# Patient Record
Sex: Female | Born: 1945 | Race: White | Hispanic: No | Marital: Married | Smoking: Never smoker
Health system: Southern US, Community
[De-identification: ages and names within clinical notes are randomized; demographics above are authoritative.]

## PROBLEM LIST (undated history)

## (undated) DIAGNOSIS — R319 Hematuria, unspecified: Secondary | ICD-10-CM

## (undated) DIAGNOSIS — M8580 Other specified disorders of bone density and structure, unspecified site: Secondary | ICD-10-CM

## (undated) HISTORY — DX: Other specified disorders of bone density and structure, unspecified site: M85.80

## (undated) HISTORY — DX: Hematuria, unspecified: R31.9

---

## 2000-05-08 ENCOUNTER — Emergency Department (HOSPITAL_COMMUNITY): Admission: EM | Admit: 2000-05-08 | Discharge: 2000-05-08 | Payer: Self-pay | Admitting: Emergency Medicine

## 2002-11-19 HISTORY — PX: CYSTOSCOPY: SUR368

## 2004-06-12 LAB — HM COLONOSCOPY

## 2013-02-04 DIAGNOSIS — M8580 Other specified disorders of bone density and structure, unspecified site: Secondary | ICD-10-CM | POA: Insufficient documentation

## 2013-02-13 ENCOUNTER — Encounter: Payer: Self-pay | Admitting: Family Medicine

## 2013-02-13 ENCOUNTER — Ambulatory Visit (INDEPENDENT_AMBULATORY_CARE_PROVIDER_SITE_OTHER): Payer: BC Managed Care – PPO | Admitting: Family Medicine

## 2013-02-13 ENCOUNTER — Other Ambulatory Visit (HOSPITAL_COMMUNITY)
Admission: RE | Admit: 2013-02-13 | Discharge: 2013-02-13 | Disposition: A | Discharge status: Home / Self Care | Payer: BC Managed Care – PPO | Source: Ambulatory Visit | Attending: Family Medicine | Admitting: Family Medicine

## 2013-02-13 VITALS — BP 127/78 | HR 73 | Ht 64.0 in | Wt 136.0 lb

## 2013-02-13 DIAGNOSIS — Z0000 Encounter for general adult medical examination without abnormal findings: Secondary | ICD-10-CM

## 2013-02-13 DIAGNOSIS — Z124 Encounter for screening for malignant neoplasm of cervix: Secondary | ICD-10-CM

## 2013-02-13 DIAGNOSIS — Z1151 Encounter for screening for human papillomavirus (HPV): Secondary | ICD-10-CM | POA: Insufficient documentation

## 2013-02-13 DIAGNOSIS — R87810 Cervical high risk human papillomavirus (HPV) DNA test positive: Secondary | ICD-10-CM | POA: Insufficient documentation

## 2013-02-13 DIAGNOSIS — I10 Essential (primary) hypertension: Secondary | ICD-10-CM

## 2013-02-13 DIAGNOSIS — Z01419 Encounter for gynecological examination (general) (routine) without abnormal findings: Secondary | ICD-10-CM | POA: Insufficient documentation

## 2013-02-13 LAB — POCT URINALYSIS DIPSTICK
Bilirubin, UA: NEGATIVE
Blood, UA: NEGATIVE
Clarity, UA: NEGATIVE
Glucose, UA: NEGATIVE
Ketones, UA: NEGATIVE
Nitrite, UA: NEGATIVE
Protein, UA: NEGATIVE
Spec Grav, UA: 1.025
Urobilinogen, UA: NEGATIVE
pH, UA: 6

## 2013-02-13 MED ORDER — LISINOPRIL-HYDROCHLOROTHIAZIDE 20-25 MG PO TABS: 1.0000 | ORAL_TABLET | Freq: Every day | ORAL | Status: DC

## 2013-02-13 MED ORDER — ZOLEDRONIC ACID 5 MG/100ML IV SOLN: 5.0000 mg | Freq: Once | INTRAVENOUS | Status: DC

## 2013-02-13 NOTE — Progress Notes (Signed)
Subjective:    Patient ID: Tabitha Gutierrez, female   DOB: May 15, 1946, 67 y.o.   MRN: [id number]  HPI [name] is here today for her annual CPE with pap.  She has done well since her last office visit.    Review of Systems  Constitutional: Negative for chills, appetite change, fatigue and unexpected weight change.  HENT: Negative for neck pain, neck stiffness and voice change.   Eyes: Negative for visual disturbance.  Respiratory: Negative for cough and shortness of breath.   Cardiovascular: Negative for chest pain.  Gastrointestinal: Negative for abdominal pain, diarrhea and constipation.  Genitourinary: Negative for dysuria, vaginal bleeding, vaginal discharge and pelvic pain.  Musculoskeletal: Negative for myalgias, arthralgias and gait problem.  Skin: Negative for pallor and rash.  Neurological: Negative for dizziness, weakness and light-headedness.  Psychiatric/Behavioral: Negative for sleep disturbance.       Objective:   Physical Exam  Constitutional: She is oriented to person, place, and time. She appears well-developed and well-nourished.  HENT:  Head: Normocephalic and atraumatic.  Right Ear: External ear normal.  Left Ear: External ear normal.  Nose: Nose normal.  Mouth/Throat: Oropharynx is clear and moist.  Eyes: Conjunctivae and EOM are normal. Pupils are equal, round, and reactive to light.  Neck: Normal range of motion. No thyromegaly present.  Cardiovascular: Normal rate, regular rhythm, normal heart sounds and intact distal pulses.  Exam reveals no gallop and no friction rub.   No murmur heard. Pulmonary/Chest: Effort normal and breath sounds normal.  Abdominal: Soft. Bowel sounds are normal.  Genitourinary: Uterus normal. No vaginal discharge found.  Musculoskeletal: Normal range of motion. She exhibits no edema and no tenderness.  Lymphadenopathy:    She has no cervical adenopathy.  Neurological: She is alert and oriented to person, place, and time. She has normal  reflexes.  Skin: Skin is warm and dry.  Psychiatric: She has a normal mood and affect. Her behavior is normal. Judgment and thought content normal.       Assessment:     CPE Hypertension    Plan:      Since she had an abnormal pap years ago, we'll check her from HPV 16/18 to be sure that we can discontinue pap smears in the future.    Refilled her lisinopril.  Refilled her Reclast

## 2013-02-15 ENCOUNTER — Encounter: Payer: Self-pay | Admitting: Family Medicine

## 2013-02-15 DIAGNOSIS — I10 Essential (primary) hypertension: Secondary | ICD-10-CM | POA: Insufficient documentation

## 2013-02-15 NOTE — Patient Instructions (Addendum)
Preventive Care for Adults, Female A healthy lifestyle and preventive care can promote health and wellness. Preventive health guidelines for women include the following key practices.  A routine yearly physical is a good way to check with your caregiver about your health and preventive screening. It is a chance to share any concerns and updates on your health, and to receive a thorough exam.  Visit your dentist for a routine exam and preventive care every 6 months. Brush your teeth twice a day and floss once a day. Good oral hygiene prevents tooth decay and gum disease.  The frequency of eye exams is based on your age, health, family medical history, use of contact lenses, and other factors. Follow your caregiver's recommendations for frequency of eye exams.  Eat a healthy diet. Foods like vegetables, fruits, whole grains, low-fat dairy products, and lean protein foods contain the nutrients you need without too many calories. Decrease your intake of foods high in solid fats, added sugars, and salt. Eat the right amount of calories for you.Get information about a proper diet from your caregiver, if necessary.  Regular physical exercise is one of the most important things you can do for your health. Most adults should get at least 150 minutes of moderate-intensity exercise (any activity that increases your heart rate and causes you to sweat) each week. In addition, most adults need muscle-strengthening exercises on 2 or more days a week.  Maintain a healthy weight. The body mass index (BMI) is a screening tool to identify possible weight problems. It provides an estimate of body fat based on height and weight. Your caregiver can help determine your BMI, and can help you achieve or maintain a healthy weight.For adults 20 years and older:  A BMI below 18.5 is considered underweight.  A BMI of 18.5 to 24.9 is normal.  A BMI of 25 to 29.9 is considered overweight.  A BMI of 30 and above is  considered obese.  Maintain normal blood lipids and cholesterol levels by exercising and minimizing your intake of saturated fat. Eat a balanced diet with plenty of fruit and vegetables. Blood tests for lipids and cholesterol should begin at age 20 and be repeated every 5 years. If your lipid or cholesterol levels are high, you are over 50, or you are at high risk for heart disease, you may need your cholesterol levels checked more frequently.Ongoing high lipid and cholesterol levels should be treated with medicines if diet and exercise are not effective.  If you smoke, find out from your caregiver how to quit. If you do not use tobacco, do not start.  If you are pregnant, do not drink alcohol. If you are breastfeeding, be very cautious about drinking alcohol. If you are not pregnant and choose to drink alcohol, do not exceed 1 drink per day. One drink is considered to be 12 ounces (355 mL) of beer, 5 ounces (148 mL) of wine, or 1.5 ounces (44 mL) of liquor.  Avoid use of street drugs. Do not share needles with anyone. Ask for help if you need support or instructions about stopping the use of drugs.  High blood pressure causes heart disease and increases the risk of stroke. Your blood pressure should be checked at least every 1 to 2 years. Ongoing high blood pressure should be treated with medicines if weight loss and exercise are not effective.  If you are 55 to 65 years old, ask your caregiver if you should take aspirin to prevent strokes.  Diabetes   screening involves taking a blood sample to check your fasting blood sugar level. This should be done once every 3 years, after age 45, if you are within normal weight and without risk factors for diabetes. Testing should be considered at a younger age or be carried out more frequently if you are overweight and have at least 1 risk factor for diabetes.  Breast cancer screening is essential preventive care for women. You should practice "breast  self-awareness." This means understanding the normal appearance and feel of your breasts and may include breast self-examination. Any changes detected, no matter how small, should be reported to a caregiver. Women in their 20s and 30s should have a clinical breast exam (CBE) by a caregiver as part of a regular health exam every 1 to 3 years. After age 40, women should have a CBE every year. Starting at age 40, women should consider having a mammography (breast X-ray test) every year. Women who have a family history of breast cancer should talk to their caregiver about genetic screening. Women at a high risk of breast cancer should talk to their caregivers about having magnetic resonance imaging (MRI) and a mammography every year.  The Pap test is a screening test for cervical cancer. A Pap test can show cell changes on the cervix that might become cervical cancer if left untreated. A Pap test is a procedure in which cells are obtained and examined from the lower end of the uterus (cervix).  Women should have a Pap test starting at age 21.  Between ages 21 and 29, Pap tests should be repeated every 2 years.  Beginning at age 30, you should have a Pap test every 3 years as long as the past 3 Pap tests have been normal.  Some women have medical problems that increase the chance of getting cervical cancer. Talk to your caregiver about these problems. It is especially important to talk to your caregiver if a new problem develops soon after your last Pap test. In these cases, your caregiver may recommend more frequent screening and Pap tests.  The above recommendations are the same for women who have or have not gotten the vaccine for human papillomavirus (HPV).  If you had a hysterectomy for a problem that was not cancer or a condition that could lead to cancer, then you no longer need Pap tests. Even if you no longer need a Pap test, a regular exam is a good idea to make sure no other problems are  starting.  If you are between ages 65 and 70, and you have had normal Pap tests going back 10 years, you no longer need Pap tests. Even if you no longer need a Pap test, a regular exam is a good idea to make sure no other problems are starting.  If you have had past treatment for cervical cancer or a condition that could lead to cancer, you need Pap tests and screening for cancer for at least 20 years after your treatment.  If Pap tests have been discontinued, risk factors (such as a new sexual partner) need to be reassessed to determine if screening should be resumed.  The HPV test is an additional test that may be used for cervical cancer screening. The HPV test looks for the virus that can cause the cell changes on the cervix. The cells collected during the Pap test can be tested for HPV. The HPV test could be used to screen women aged 30 years and older, and should   be used in women of any age who have unclear Pap test results. After the age of 30, women should have HPV testing at the same frequency as a Pap test.  Colorectal cancer can be detected and often prevented. Most routine colorectal cancer screening begins at the age of 50 and continues through age 75. However, your caregiver may recommend screening at an earlier age if you have risk factors for colon cancer. On a yearly basis, your caregiver may provide home test kits to check for hidden blood in the stool. Use of a small camera at the end of a tube, to directly examine the colon (sigmoidoscopy or colonoscopy), can detect the earliest forms of colorectal cancer. Talk to your caregiver about this at age 50, when routine screening begins. Direct examination of the colon should be repeated every 5 to 10 years through age 75, unless early forms of pre-cancerous polyps or small growths are found.  Hepatitis C blood testing is recommended for all people born from 1945 through 1965 and any individual with known risks for hepatitis C.  Practice  safe sex. Use condoms and avoid high-risk sexual practices to reduce the spread of sexually transmitted infections (STIs). STIs include gonorrhea, chlamydia, syphilis, trichomonas, herpes, HPV, and human immunodeficiency virus (HIV). Herpes, HIV, and HPV are viral illnesses that have no cure. They can result in disability, cancer, and death. Sexually active women aged 25 and younger should be checked for chlamydia. Older women with new or multiple partners should also be tested for chlamydia. Testing for other STIs is recommended if you are sexually active and at increased risk.  Osteoporosis is a disease in which the bones lose minerals and strength with aging. This can result in serious bone fractures. The risk of osteoporosis can be identified using a bone density scan. Women ages 65 and over and women at risk for fractures or osteoporosis should discuss screening with their caregivers. Ask your caregiver whether you should take a calcium supplement or vitamin D to reduce the rate of osteoporosis.  Menopause can be associated with physical symptoms and risks. Hormone replacement therapy is available to decrease symptoms and risks. You should talk to your caregiver about whether hormone replacement therapy is right for you.  Use sunscreen with sun protection factor (SPF) of 30 or more. Apply sunscreen liberally and repeatedly throughout the day. You should seek shade when your shadow is shorter than you. Protect yourself by wearing long sleeves, pants, a wide-brimmed hat, and sunglasses year round, whenever you are outdoors.  Once a month, do a whole body skin exam, using a mirror to look at the skin on your back. Notify your caregiver of new moles, moles that have irregular borders, moles that are larger than a pencil eraser, or moles that have changed in shape or color.  Stay current with required immunizations.  Influenza. You need a dose every fall (or winter). The composition of the flu vaccine  changes each year, so being vaccinated once is not enough.  Pneumococcal polysaccharide. You need 1 to 2 doses if you smoke cigarettes or if you have certain chronic medical conditions. You need 1 dose at age 65 (or older) if you have never been vaccinated.  Tetanus, diphtheria, pertussis (Tdap, Td). Get 1 dose of Tdap vaccine if you are younger than age 65, are over 65 and have contact with an infant, are a healthcare worker, are pregnant, or simply want to be protected from whooping cough. After that, you need a Td   booster dose every 10 years. Consult your caregiver if you have not had at least 3 tetanus and diphtheria-containing shots sometime in your life or have a deep or dirty wound.  HPV. You need this vaccine if you are a woman age 26 or younger. The vaccine is given in 3 doses over 6 months.  Measles, mumps, rubella (MMR). You need at least 1 dose of MMR if you were born in 1957 or later. You may also need a second dose.  Meningococcal. If you are age 19 to 21 and a first-year college student living in a residence hall, or have one of several medical conditions, you need to get vaccinated against meningococcal disease. You may also need additional booster doses.  Zoster (shingles). If you are age 60 or older, you should get this vaccine.  Varicella (chickenpox). If you have never had chickenpox or you were vaccinated but received only 1 dose, talk to your caregiver to find out if you need this vaccine.  Hepatitis A. You need this vaccine if you have a specific risk factor for hepatitis A virus infection or you simply wish to be protected from this disease. The vaccine is usually given as 2 doses, 6 to 18 months apart.  Hepatitis B. You need this vaccine if you have a specific risk factor for hepatitis B virus infection or you simply wish to be protected from this disease. The vaccine is given in 3 doses, usually over 6 months. Preventive Services / Frequency Ages 19 to 39  Blood  pressure check.** / Every 1 to 2 years.  Lipid and cholesterol check.** / Every 5 years beginning at age 20.  Clinical breast exam.** / Every 3 years for women in their 20s and 30s.  Pap test.** / Every 2 years from ages 21 through 29. Every 3 years starting at age 30 through age 65 or 70 with a history of 3 consecutive normal Pap tests.  HPV screening.** / Every 3 years from ages 30 through ages 65 to 70 with a history of 3 consecutive normal Pap tests.  Hepatitis C blood test.** / For any individual with known risks for hepatitis C.  Skin self-exam. / Monthly.  Influenza immunization.** / Every year.  Pneumococcal polysaccharide immunization.** / 1 to 2 doses if you smoke cigarettes or if you have certain chronic medical conditions.  Tetanus, diphtheria, pertussis (Tdap, Td) immunization. / A one-time dose of Tdap vaccine. After that, you need a Td booster dose every 10 years.  HPV immunization. / 3 doses over 6 months, if you are 26 and younger.  Measles, mumps, rubella (MMR) immunization. / You need at least 1 dose of MMR if you were born in 1957 or later. You may also need a second dose.  Meningococcal immunization. / 1 dose if you are age 19 to 21 and a first-year college student living in a residence hall, or have one of several medical conditions, you need to get vaccinated against meningococcal disease. You may also need additional booster doses.  Varicella immunization.** / Consult your caregiver.  Hepatitis A immunization.** / Consult your caregiver. 2 doses, 6 to 18 months apart.  Hepatitis B immunization.** / Consult your caregiver. 3 doses usually over 6 months. Ages 40 to 64  Blood pressure check.** / Every 1 to 2 years.  Lipid and cholesterol check.** / Every 5 years beginning at age 20.  Clinical breast exam.** / Every year after age 40.  Mammogram.** / Every year beginning at age 40   and continuing for as long as you are in good health. Consult with your  caregiver.  Pap test.** / Every 3 years starting at age 30 through age 65 or 70 with a history of 3 consecutive normal Pap tests.  HPV screening.** / Every 3 years from ages 30 through ages 65 to 70 with a history of 3 consecutive normal Pap tests.  Fecal occult blood test (FOBT) of stool. / Every year beginning at age 50 and continuing until age 75. You may not need to do this test if you get a colonoscopy every 10 years.  Flexible sigmoidoscopy or colonoscopy.** / Every 5 years for a flexible sigmoidoscopy or every 10 years for a colonoscopy beginning at age 50 and continuing until age 75.  Hepatitis C blood test.** / For all people born from 1945 through 1965 and any individual with known risks for hepatitis C.  Skin self-exam. / Monthly.  Influenza immunization.** / Every year.  Pneumococcal polysaccharide immunization.** / 1 to 2 doses if you smoke cigarettes or if you have certain chronic medical conditions.  Tetanus, diphtheria, pertussis (Tdap, Td) immunization.** / A one-time dose of Tdap vaccine. After that, you need a Td booster dose every 10 years.  Measles, mumps, rubella (MMR) immunization. / You need at least 1 dose of MMR if you were born in 1957 or later. You may also need a second dose.  Varicella immunization.** / Consult your caregiver.  Meningococcal immunization.** / Consult your caregiver.  Hepatitis A immunization.** / Consult your caregiver. 2 doses, 6 to 18 months apart.  Hepatitis B immunization.** / Consult your caregiver. 3 doses, usually over 6 months. Ages 65 and over  Blood pressure check.** / Every 1 to 2 years.  Lipid and cholesterol check.** / Every 5 years beginning at age 20.  Clinical breast exam.** / Every year after age 40.  Mammogram.** / Every year beginning at age 40 and continuing for as long as you are in good health. Consult with your caregiver.  Pap test.** / Every 3 years starting at age 30 through age 65 or 70 with a 3  consecutive normal Pap tests. Testing can be stopped between 65 and 70 with 3 consecutive normal Pap tests and no abnormal Pap or HPV tests in the past 10 years.  HPV screening.** / Every 3 years from ages 30 through ages 65 or 70 with a history of 3 consecutive normal Pap tests. Testing can be stopped between 65 and 70 with 3 consecutive normal Pap tests and no abnormal Pap or HPV tests in the past 10 years.  Fecal occult blood test (FOBT) of stool. / Every year beginning at age 50 and continuing until age 75. You may not need to do this test if you get a colonoscopy every 10 years.  Flexible sigmoidoscopy or colonoscopy.** / Every 5 years for a flexible sigmoidoscopy or every 10 years for a colonoscopy beginning at age 50 and continuing until age 75.  Hepatitis C blood test.** / For all people born from 1945 through 1965 and any individual with known risks for hepatitis C.  Osteoporosis screening.** / A one-time screening for women ages 65 and over and women at risk for fractures or osteoporosis.  Skin self-exam. / Monthly.  Influenza immunization.** / Every year.  Pneumococcal polysaccharide immunization.** / 1 dose at age 65 (or older) if you have never been vaccinated.  Tetanus, diphtheria, pertussis (Tdap, Td) immunization. / A one-time dose of Tdap vaccine if you are over   65 and have contact with an infant, are a healthcare worker, or simply want to be protected from whooping cough. After that, you need a Td booster dose every 10 years.  Varicella immunization.** / Consult your caregiver.  Meningococcal immunization.** / Consult your caregiver.  Hepatitis A immunization.** / Consult your caregiver. 2 doses, 6 to 18 months apart.  Hepatitis B immunization.** / Check with your caregiver. 3 doses, usually over 6 months. ** Family history and personal history of risk and conditions may change your caregiver's recommendations. Document Released: 01/01/2002 Document Revised: 01/28/2012  Document Reviewed: 04/02/2011 [employer] Patient Information 2013 [employer].  

## 2013-02-16 ENCOUNTER — Telehealth: Payer: Self-pay | Admitting: *Deleted

## 2013-02-16 NOTE — Telephone Encounter (Signed)
PATIENT REPORTS THAT SHE IS TAKING TRIMETHOPRIM 100 MG, ONE PER DAY

## 2013-02-16 NOTE — Telephone Encounter (Signed)
Pt called to add another med to her list. PG

## 2013-02-24 ENCOUNTER — Encounter: Payer: Self-pay | Admitting: Family Medicine

## 2013-02-26 ENCOUNTER — Encounter: Payer: Self-pay | Admitting: Family Medicine

## 2013-03-13 ENCOUNTER — Telehealth: Payer: Self-pay | Admitting: *Deleted

## 2013-03-13 NOTE — Telephone Encounter (Signed)
Called PT to inform her that we sent her Reclast infusion request to the Infusion center ([facility])  She will be contacted by the staff to schedule her next reclast infusion. PG

## 2013-03-16 ENCOUNTER — Telehealth: Payer: Self-pay | Admitting: *Deleted

## 2013-03-16 NOTE — Telephone Encounter (Signed)
PT CALLED AND FAXED SOMETHING THAT NEEDS ACTION ON YOUR PART.

## 2013-03-16 NOTE — Telephone Encounter (Signed)
Pt was informed her referral was sent to [place] and someone from [facility] will contact her to set up her infusion. PG

## 2013-03-27 ENCOUNTER — Telehealth: Payer: Self-pay | Admitting: *Deleted

## 2013-03-27 NOTE — Telephone Encounter (Signed)
PT LM ON VOICE MAIL SAYING SHE HAD AN INFUSION DONE AND THEY ADVISED HER THAT HER POTASSIUM LEVELS WERE ELEVATED- LABS SAID THEY ARE AT 5.8  PT IS WANTING A CALL BACK FROM THE NURSE.  SHE WANTS TO KNOW WHAT DR. ZANARD WANTS TO DO FOR THIS ISSUE. PT  NOT SURE WHAT TO DO. PT [phone]

## 2013-03-30 ENCOUNTER — Telehealth: Payer: Self-pay | Admitting: Family Medicine

## 2013-03-30 NOTE — Telephone Encounter (Signed)
Elease Hashimoto,  Did you speak with [place]?  Regarding elevated potassium, honestly I would have totally ignored that elevation.  99% of the time it is elevated because of the handling of the blood and the potassium comes out of the broken cells.  I called and left her a message.  I told her that if she wants to come by and have it checked then she could do that.

## 2013-03-30 NOTE — Telephone Encounter (Signed)
CONNIE AT DR. Trisha Mangle OFFICE [facility]-  CALLED TO LET DR. ZANARD KNOW THAT PT HAD LAB DONE WITH THERE OFFICE.  POTASSIUM CAME BACK AT 5.8

## 2013-03-31 ENCOUNTER — Telehealth: Payer: Self-pay | Admitting: *Deleted

## 2013-03-31 NOTE — Telephone Encounter (Signed)
Left Voice mail to contact our office.  We could recheck her potassium level in a few weeks to ensure it has returned to normal. PG

## 2013-12-20 LAB — HM MAMMOGRAPHY

## 2014-03-03 ENCOUNTER — Other Ambulatory Visit: Payer: Self-pay | Admitting: *Deleted

## 2014-03-03 DIAGNOSIS — Z0000 Encounter for general adult medical examination without abnormal findings: Secondary | ICD-10-CM

## 2014-03-04 ENCOUNTER — Other Ambulatory Visit: Payer: BC Managed Care – PPO

## 2014-03-04 LAB — COMPLETE METABOLIC PANEL WITH GFR
ALT: 16 U/L (ref 0–35)
AST: 25 U/L (ref 0–37)
Albumin: 4.8 g/dL (ref 3.5–5.2)
Alkaline Phosphatase: 51 U/L (ref 39–117)
BUN: 19 mg/dL (ref 6–23)
CO2: 28 mEq/L (ref 19–32)
Calcium: 9.9 mg/dL (ref 8.4–10.5)
Chloride: 95 mEq/L — ABNORMAL LOW (ref 96–112)
Creat: 0.98 mg/dL (ref 0.50–1.10)
GFR, Est African American: 70 mL/min
GFR, Est Non African American: 61 mL/min
Glucose, Bld: 88 mg/dL (ref 70–99)
Potassium: 4.1 mEq/L (ref 3.5–5.3)
Sodium: 134 mEq/L — ABNORMAL LOW (ref 135–145)
Total Bilirubin: 0.5 mg/dL (ref 0.2–1.2)
Total Protein: 7 g/dL (ref 6.0–8.3)

## 2014-03-04 LAB — CBC WITH DIFFERENTIAL/PLATELET
Basophils Absolute: 0.1 10*3/uL (ref 0.0–0.1)
Basophils Relative: 1 % (ref 0–1)
Eosinophils Absolute: 0.1 10*3/uL (ref 0.0–0.7)
Eosinophils Relative: 2 % (ref 0–5)
HCT: 35.7 % — ABNORMAL LOW (ref 36.0–46.0)
Hemoglobin: 11.9 g/dL — ABNORMAL LOW (ref 12.0–15.0)
Lymphocytes Relative: 25 % (ref 12–46)
Lymphs Abs: 1.3 10*3/uL (ref 0.7–4.0)
MCH: 30.1 pg (ref 26.0–34.0)
MCHC: 33.3 g/dL (ref 30.0–36.0)
MCV: 90.4 fL (ref 78.0–100.0)
Monocytes Absolute: 0.5 10*3/uL (ref 0.1–1.0)
Monocytes Relative: 10 % (ref 3–12)
Neutro Abs: 3.1 10*3/uL (ref 1.7–7.7)
Neutrophils Relative %: 62 % (ref 43–77)
Platelets: 226 10*3/uL (ref 150–400)
RBC: 3.95 MIL/uL (ref 3.87–5.11)
RDW: 13.6 % (ref 11.5–15.5)
WBC: 5 10*3/uL (ref 4.0–10.5)

## 2014-03-04 LAB — TSH: TSH: 4.644 u[IU]/mL — ABNORMAL HIGH (ref 0.350–4.500)

## 2014-03-04 LAB — LIPID PANEL
Cholesterol: 184 mg/dL (ref 0–200)
HDL: 84 mg/dL (ref >39)
LDL Cholesterol: 74 mg/dL (ref 0–99)
Total CHOL/HDL Ratio: 2.2 Ratio
Triglycerides: 128 mg/dL (ref <150)
VLDL: 26 mg/dL (ref 0–40)

## 2014-03-11 ENCOUNTER — Ambulatory Visit (INDEPENDENT_AMBULATORY_CARE_PROVIDER_SITE_OTHER): Payer: BC Managed Care – PPO | Admitting: Family Medicine

## 2014-03-11 ENCOUNTER — Encounter: Payer: Self-pay | Admitting: Family Medicine

## 2014-03-11 VITALS — BP 124/70 | HR 60 | Resp 16 | Ht 63.0 in | Wt 127.0 lb

## 2014-03-11 DIAGNOSIS — Z0000 Encounter for general adult medical examination without abnormal findings: Secondary | ICD-10-CM

## 2014-03-11 DIAGNOSIS — B977 Papillomavirus as the cause of diseases classified elsewhere: Secondary | ICD-10-CM

## 2014-03-11 DIAGNOSIS — Z124 Encounter for screening for malignant neoplasm of cervix: Secondary | ICD-10-CM

## 2014-03-11 DIAGNOSIS — Z23 Encounter for immunization: Secondary | ICD-10-CM

## 2014-03-11 DIAGNOSIS — I10 Essential (primary) hypertension: Secondary | ICD-10-CM

## 2014-03-11 LAB — POCT URINALYSIS DIPSTICK
Bilirubin, UA: NEGATIVE
Blood, UA: NEGATIVE
Glucose, UA: NEGATIVE
Ketones, UA: NEGATIVE
Leukocytes, UA: NEGATIVE
Nitrite, UA: NEGATIVE
Protein, UA: NEGATIVE
Spec Grav, UA: <=1.005
Urobilinogen, UA: NEGATIVE
pH, UA: 5

## 2014-03-11 MED ORDER — LISINOPRIL-HYDROCHLOROTHIAZIDE 20-25 MG PO TABS: 1.0000 | ORAL_TABLET | Freq: Every day | ORAL | Status: AC

## 2014-03-11 NOTE — Progress Notes (Signed)
Subjective:    Patient ID: Tabitha Gutierrez, female    DOB: October 14, 1946, 68 y.o.   MRN: [id number]  HPI   Tabitha Gutierrez is here today for a CPE/PAP and to discuss her lab results.  She also needs to have her BP medication refilled. She has done well since her last visit.  Her pap last year was normal but she did test positive for HPV 16 and was sent to Dr. Silvestre Gunner'Keeffe for a colposcopy which was negative.  He also did an ECC which was also normal.       Review of Systems  Constitutional: Negative for activity change, appetite change, fatigue and unexpected weight change.  HENT: Negative for congestion, dental problem, ear pain, hearing loss, trouble swallowing and voice change.   Eyes: Negative for pain, redness and visual disturbance.  Respiratory: Negative for cough and shortness of breath.   Cardiovascular: Negative for chest pain, palpitations and leg swelling.  Gastrointestinal: Negative for nausea, vomiting, abdominal pain, diarrhea, constipation and blood in stool.  Endocrine: Negative for cold intolerance, heat intolerance, polydipsia, polyphagia and polyuria.  Genitourinary: Negative for dysuria, urgency, frequency, hematuria, vaginal discharge and pelvic pain.  Musculoskeletal: Negative for arthralgias, back pain, joint swelling, myalgias and neck pain.  Skin: Negative for rash.  Neurological: Negative for dizziness, weakness and headaches.  Hematological: Negative for adenopathy. Does not bruise/bleed easily.  Psychiatric/Behavioral: Negative for sleep disturbance, dysphoric mood and decreased concentration. The patient is not nervous/anxious.      Past Medical History  Diagnosis Date  . Hematuria   . Osteopenia      Past Surgical History  Procedure Laterality Date  . Cystoscopy  2004    WNL - Tannenbaum     History   Social History Narrative   Marital Status:  Divorced    Children:  G1 P1/0/0/1   Pets: Cats (2)     Living Situation: Lives alone   Occupation: [employer]   Education:  14 years   Tobacco Use/Exposure:  None    Alcohol Use:  None   Drug Use:  None   Diet:  Regular   Exercise:  Walking   Hobbies: Volunteering     Family History  Problem Relation Age of Onset  . Vascular Disease Mother   . Ovarian cancer Mother   . Thyroid disease Father   . Breast cancer Maternal Aunt      Current Outpatient Prescriptions on File Prior to Visit  Medication Sig Dispense Refill  . trimethoprim (TRIMPEX) 100 MG tablet Take 100 mg by mouth 2 (two) times daily.      . zoledronic acid (RECLAST) 5 MG/100ML SOLN Inject 100 mLs (5 mg total) into the vein once.  100 mL  0   No current facility-administered medications on file prior to visit.     No Known Allergies   Immunization History  Administered Date(s) Administered  . Pneumococcal Conjugate-13 03/11/2014  . Tdap 10/31/2006  . Zoster 07/25/2007       Objective:   Physical Exam  Constitutional: She is oriented to person, place, and time. She appears well-developed and well-nourished.  HENT:  Head: Normocephalic and atraumatic.  Right Ear: External ear normal.  Left Ear: External ear normal.  Nose: Nose normal.  Mouth/Throat: Oropharynx is clear and moist.  Eyes: Conjunctivae and EOM are normal. Pupils are equal, round, and reactive to light.  Neck: Normal range of motion. No thyromegaly present.  Cardiovascular: Normal rate, regular rhythm, normal heart sounds and intact  distal pulses.  Exam reveals no gallop and no friction rub.   No murmur heard. Pulmonary/Chest: Effort normal and breath sounds normal. Right breast exhibits no inverted nipple, no mass, no nipple discharge, no skin change and no tenderness. Left breast exhibits no inverted nipple, no mass, no nipple discharge, no skin change and no tenderness. Breasts are symmetrical.  Abdominal: Soft. Bowel sounds are normal. Hernia confirmed negative in the right inguinal area and confirmed negative in the left inguinal area.   Genitourinary: Vagina normal and uterus normal. Pelvic exam was performed with patient supine. There is no rash, tenderness or lesion on the right labia. There is no rash, tenderness or lesion on the left labia. No vaginal discharge found.  Musculoskeletal: Normal range of motion. She exhibits no edema and no tenderness.  Lymphadenopathy:    She has no cervical adenopathy.       Right: No inguinal adenopathy present.       Left: No inguinal adenopathy present.  Neurological: She is alert and oriented to person, place, and time. She has normal reflexes.  Skin: Skin is warm and dry.  Psychiatric: She has a normal mood and affect. Her behavior is normal. Judgment and thought content normal.      Assessment & Plan:    Tabitha Gutierrez was seen today for annual exam and medication management.  Diagnoses and associated orders for this visit:  Routine general medical examination at a health care facility Comments: Normal exam; She is due for a colonocopy and will contact Dr. Loreta AveMann for this.   - EKG 12-Lead - POCT urinalysis dipstick  Need for prophylactic vaccination against Streptococcus pneumoniae (pneumococcus) Comments: She received Prevnar 13 without difficulty.   - Pneumococcal conjugate vaccine 13-valent  Essential hypertension, benign Comments: Her BP is very well controlled.  She may try cutting her dosage in 1/2 and will monitor her BP to make sure that it remains normal.   - lisinopril-hydrochlorothiazide (PRINZIDE,ZESTORETIC) 20-25 MG per tablet; Take 1 tablet by mouth daily.  HPV (human papilloma virus) infection Comments: Tabitha Gutierrez's pap last year showed that she is positive for HPV 16.  According to the ASCCP guidelines, we are to repeat her pap with co-testing again.     TIME SPENT "FACE TO FACE" WITH PATIENT -  45 MINS

## 2014-03-14 DIAGNOSIS — B977 Papillomavirus as the cause of diseases classified elsewhere: Secondary | ICD-10-CM | POA: Insufficient documentation

## 2014-03-14 DIAGNOSIS — Z23 Encounter for immunization: Secondary | ICD-10-CM | POA: Insufficient documentation

## 2014-03-14 DIAGNOSIS — Z0000 Encounter for general adult medical examination without abnormal findings: Secondary | ICD-10-CM | POA: Insufficient documentation

## 2014-03-14 NOTE — Patient Instructions (Signed)

## 2014-03-15 ENCOUNTER — Other Ambulatory Visit (HOSPITAL_COMMUNITY)
Admission: RE | Admit: 2014-03-15 | Discharge: 2014-03-15 | Disposition: A | Discharge status: Home / Self Care | Payer: BC Managed Care – PPO | Source: Ambulatory Visit | Attending: Family Medicine | Admitting: Family Medicine

## 2014-03-15 DIAGNOSIS — Z1151 Encounter for screening for human papillomavirus (HPV): Secondary | ICD-10-CM | POA: Insufficient documentation

## 2014-03-15 DIAGNOSIS — Z124 Encounter for screening for malignant neoplasm of cervix: Secondary | ICD-10-CM | POA: Insufficient documentation

## 2014-03-15 DIAGNOSIS — R87810 Cervical high risk human papillomavirus (HPV) DNA test positive: Secondary | ICD-10-CM | POA: Insufficient documentation

## 2014-03-15 NOTE — Addendum Note (Signed)
Addended by: Clint BolderGIL, Anderson Middlebrooks D on: 03/15/2014 08:07 AM   Modules accepted: Orders

## 2014-04-01 ENCOUNTER — Telehealth: Payer: Self-pay

## 2014-04-01 NOTE — Telephone Encounter (Signed)
error 

## 2014-04-15 ENCOUNTER — Other Ambulatory Visit: Payer: Self-pay | Admitting: Family Medicine

## 2014-04-15 DIAGNOSIS — M8580 Other specified disorders of bone density and structure, unspecified site: Secondary | ICD-10-CM

## 2014-04-15 MED ORDER — RISEDRONATE SODIUM 150 MG PO TABS: 150.0000 mg | ORAL_TABLET | ORAL | Status: AC

## 2016-08-15 ENCOUNTER — Emergency Department (HOSPITAL_COMMUNITY): Payer: BLUE CROSS/BLUE SHIELD

## 2016-08-15 ENCOUNTER — Encounter (HOSPITAL_COMMUNITY): Payer: Self-pay | Admitting: Emergency Medicine

## 2016-08-15 ENCOUNTER — Emergency Department (HOSPITAL_COMMUNITY)
Admission: EM | Admit: 2016-08-15 | Discharge: 2016-08-15 | Disposition: A | Discharge status: Home / Self Care | Payer: BLUE CROSS/BLUE SHIELD | Attending: Emergency Medicine | Admitting: Emergency Medicine

## 2016-08-15 DIAGNOSIS — W010XXA Fall on same level from slipping, tripping and stumbling without subsequent striking against object, initial encounter: Secondary | ICD-10-CM | POA: Diagnosis not present

## 2016-08-15 DIAGNOSIS — Y929 Unspecified place or not applicable: Secondary | ICD-10-CM | POA: Diagnosis not present

## 2016-08-15 DIAGNOSIS — S52591A Other fractures of lower end of right radius, initial encounter for closed fracture: Secondary | ICD-10-CM | POA: Diagnosis not present

## 2016-08-15 DIAGNOSIS — IMO0002 Reserved for concepts with insufficient information to code with codable children: Secondary | ICD-10-CM

## 2016-08-15 DIAGNOSIS — S52501A Unspecified fracture of the lower end of right radius, initial encounter for closed fracture: Secondary | ICD-10-CM

## 2016-08-15 DIAGNOSIS — I10 Essential (primary) hypertension: Secondary | ICD-10-CM | POA: Diagnosis not present

## 2016-08-15 DIAGNOSIS — Y999 Unspecified external cause status: Secondary | ICD-10-CM | POA: Diagnosis not present

## 2016-08-15 DIAGNOSIS — Z79899 Other long term (current) drug therapy: Secondary | ICD-10-CM | POA: Diagnosis not present

## 2016-08-15 DIAGNOSIS — S6991XA Unspecified injury of right wrist, hand and finger(s), initial encounter: Secondary | ICD-10-CM | POA: Diagnosis present

## 2016-08-15 DIAGNOSIS — S01111A Laceration without foreign body of right eyelid and periocular area, initial encounter: Secondary | ICD-10-CM | POA: Diagnosis not present

## 2016-08-15 DIAGNOSIS — W19XXXA Unspecified fall, initial encounter: Secondary | ICD-10-CM

## 2016-08-15 DIAGNOSIS — Y9301 Activity, walking, marching and hiking: Secondary | ICD-10-CM | POA: Insufficient documentation

## 2016-08-15 MED ORDER — BACITRACIN ZINC 500 UNIT/GM EX OINT
TOPICAL_OINTMENT | Freq: Two times a day (BID) | CUTANEOUS | Status: DC
  Administered 2016-08-15: 1 via TOPICAL
  Filled 2016-08-15: qty 1.8

## 2016-08-15 MED ORDER — BUPIVACAINE HCL (PF) 0.5 % IJ SOLN: 30.0000 mL | Freq: Once | INTRAMUSCULAR | Status: DC

## 2016-08-15 MED ORDER — FENTANYL CITRATE (PF) 100 MCG/2ML IJ SOLN
50.0000 ug | Freq: Once | INTRAMUSCULAR | Status: AC
  Administered 2016-08-15: 50 ug via INTRAVENOUS
  Filled 2016-08-15: qty 2

## 2016-08-15 MED ORDER — HYDROCODONE-ACETAMINOPHEN 5-325 MG PO TABS: 1.0000 | ORAL_TABLET | Freq: Four times a day (QID) | ORAL | 0 refills | Status: AC | PRN

## 2016-08-15 NOTE — Discharge Instructions (Signed)
Read the information below.  You have a fracture of your radius and ulna. You are being placed in a splint. Please wear splint until you are evaluated by orthopedics. I spoke with orthopedics - please call today to schedule an appointment in office tomorrow.  I have prescribed vicodin for severe pain. Please take as needed.  You can try motrin for mild/moderate pain. Ice affected area for 20 minute increments.  Use the prescribed medication as directed. Please discuss all new medications with your pharmacist.   You may return to the Emergency Department at any time for worsening condition or any new symptoms that concern you. Return to ED if uncontrolled pain, you develop change in color to your fingers, fingers become cold, confusion/lethargy, vomiting, or severe headache.

## 2016-08-15 NOTE — ED Notes (Signed)
Discharge instructions, follow up care, and rx x1 reviewed with patient. Patient verbalized understanding. 

## 2016-08-15 NOTE — ED Notes (Signed)
Patient ambulated without difficulty. 

## 2016-08-15 NOTE — ED Provider Notes (Signed)
[facility]-EMERGENCY DEPT Provider Note   CSN: [id number] Arrival date & time: 08/15/16  4098     History   Chief Complaint Chief Complaint  Patient presents with  . Fall  . Wrist Injury    HPI Tabitha Gutierrez is a 70 y.o. female.  Tabitha Gutierrez is a 70 y.o. female with w/o osteopenia and HTN presents to ED with complaint of fall. Patient was on her usual morning walk when she tripped and fell on an uneven sidewalk this morning. She broke the fall with her right hand. She has associated swelling, deformity, ecchymosis, superficial abrasion, and numbness at her right wrist. She also reports she hit her head on the ground and has an associated laceration above her right eye. She denies LOC. She is not on any anti-coagulation therapy. No treatments tried PTA.       Past Medical History:  Diagnosis Date  . Hematuria   . Osteopenia     Patient Active Problem List   Diagnosis Date Noted  . Routine general medical examination at a health care facility 03/14/2014  . Need for prophylactic vaccination against Streptococcus pneumoniae (pneumococcus) 03/14/2014  . HPV (human papilloma virus) infection 03/14/2014  . Essential hypertension, benign 02/15/2013  . Osteopenia     Past Surgical History:  Procedure Laterality Date  . CYSTOSCOPY  2004   WNL - Tannenbaum    OB History    No data available       Home Medications    Prior to Admission medications   Medication Sig Start Date End Date Taking? Authorizing Provider  Ascorbic Acid (VITAMIN C) 1000 MG tablet Take 1,000 mg by mouth daily.   Yes Historical Provider, MD  CALCIUM PO Take 1 tablet by mouth daily.   Yes Historical Provider, MD  Cholecalciferol (VITAMIN D3) 1000 units CAPS Take 2,000 Units by mouth daily.   Yes Historical Provider, MD  ferrous sulfate 325 (65 FE) MG tablet Take 325 mg by mouth daily.   Yes Historical Provider, MD  lisinopril-hydrochlorothiazide (PRINZIDE,ZESTORETIC) 10-12.5 MG tablet Take 1 tablet by  mouth daily. 07/31/16  Yes Historical Provider, MD  Multiple Vitamin (MULTIVITAMIN WITH MINERALS) TABS tablet Take 1 tablet by mouth daily.   Yes Historical Provider, MD  trimethoprim (TRIMPEX) 100 MG tablet Take 50 mg by mouth 3 (three) times a week. Mondays, Wednesdays, and Fridays   Yes Historical Provider, MD  HYDROcodone-acetaminophen (NORCO/VICODIN) 5-325 MG tablet Take 1 tablet by mouth every 6 (six) hours as needed. 08/15/16   Lona Kettle, PA-C  lisinopril-hydrochlorothiazide (PRINZIDE,ZESTORETIC) 20-25 MG per tablet Take 1 tablet by mouth daily. 03/11/14 04/06/15  Gillian Scarce, MD    Family History Family History  Problem Relation Age of Onset  . Vascular Disease Mother   . Ovarian cancer Mother   . Thyroid disease Father   . Breast cancer Maternal Aunt     Social History Social History  Substance Use Topics  . Smoking status: Never Smoker  . Smokeless tobacco: Never Used  . Alcohol use No     Allergies   Review of patient's allergies indicates no known allergies.   Review of Systems Review of Systems  Constitutional: Negative for chills, diaphoresis and fever.  HENT: Negative for trouble swallowing.   Eyes: Negative for visual disturbance.  Respiratory: Negative for shortness of breath.   Cardiovascular: Negative for chest pain.  Gastrointestinal: Negative for abdominal pain, nausea and vomiting.  Genitourinary: Negative for dysuria and hematuria.  Musculoskeletal: Positive for arthralgias and  joint swelling. Negative for neck pain.  Skin: Positive for wound.  Neurological: Positive for numbness. Negative for dizziness, syncope, weakness, light-headedness and headaches.     Physical Exam Updated Vital Signs BP 122/58 (BP Location: Left Arm)   Pulse 60   Temp 98.4 F (36.9 C) (Oral)   Resp 14   Ht 5\' 4"  (1.626 m)   Wt 59 kg   SpO2 99%   BMI 22.31 kg/m   Physical Exam  Constitutional: She appears well-developed and well-nourished. No distress.    HENT:  Head: Normocephalic. Head is with laceration. Head is without raccoon's eyes and without Battle's sign.    Mouth/Throat: Uvula is midline and oropharynx is clear and moist. No trismus in the jaw. No oropharyngeal exudate.  Eyes: Conjunctivae and EOM are normal. Pupils are equal, round, and reactive to light. Right eye exhibits no discharge. Left eye exhibits no discharge. No scleral icterus.  Neck: Normal range of motion and phonation normal. Neck supple. No neck rigidity. Normal range of motion present.  Neck ROM intact.   Cardiovascular: Normal rate, regular rhythm, normal heart sounds and intact distal pulses.   No murmur heard. Pulmonary/Chest: Effort normal and breath sounds normal. No stridor. No respiratory distress. She has no wheezes. She has no rales.  Abdominal: She exhibits no distension. There is no rigidity and no CVA tenderness.  Musculoskeletal:  Right wrist: obvious deformity, swelling, and ecchymosis noted. Patient able to move fingers. Sensation intact. Capillary refill <3seconds.   Lymphadenopathy:    She has no cervical adenopathy.  Neurological: She is alert. She is not disoriented. Coordination and gait normal. GCS eye subscore is 4. GCS verbal subscore is 5. GCS motor subscore is 6.  Mental Status:  Alert, thought content appropriate, able to give a coherent history. Speech fluent without evidence of aphasia. Able to follow 2 step commands without difficulty.  Cranial Nerves:  II:  Peripheral visual fields grossly normal, pupils equal, round, reactive to light III,IV, VI: ptosis not present, extra-ocular motions intact bilaterally  V,VII: smile symmetric, facial light touch sensation equal VIII: hearing grossly normal to voice  X: uvula elevates symmetrically  XI: bilateral shoulder shrug symmetric and strong XII: midline tongue extension without fassiculations Motor:  Normal tone. Right strength testing deferred secondary to fx, 5/5 LUE, 5/5 lower  extremities bilaterally including strong and equal  Dorsiflexion/plantar flexion Sensory: light touch normal in all extremities. Cerebellar: normal finger-to-nose with left, deferred right secondary to fx.  Gait: normal gait and balance CV: distal pulses palpable throughout   Skin: Skin is warm and dry. She is not diaphoretic.     Psychiatric: She has a normal mood and affect. Her behavior is normal.     ED Treatments / Results  Labs (all labs ordered are listed, but only abnormal results are displayed) Labs Reviewed - No data to display  EKG  EKG Interpretation None       Radiology Dg Wrist Complete Right  Result Date: 08/15/2016 CLINICAL DATA:  Pain following fall EXAM: RIGHT WRIST - COMPLETE 3+ VIEW COMPARISON:  None. FINDINGS: Frontal, oblique, and lateral views were obtained. There is a comminuted fracture of the distal radial metaphysis with fracture fragments extending into the radiocarpal joint space. There is dorsal angulation distally. Several fracture fragments are impacted. There is avulsion of the ulnar styloid. No dislocation. No other fractures. No appreciable joint space narrowing. IMPRESSION: Comminuted fracture distal radius with impacted fracture fragments. Fracture fragments extend into the radiocarpal joint. There is  dorsal angulation distally. There is avulsion of the ulnar styloid. No dislocation. No appreciable joint space narrowing or erosion peer Electronically Signed   By: Bretta BangWilliam  Woodruff III M.D.   On: 08/15/2016 07:46    Procedures .Marland Kitchen.Laceration Repair Date/Time: 08/15/2016 12:55 PM Performed by: Lona KettleMEYER, Rolanda Campa LAUREL Authorized by: Lona KettleMEYER, Jahron Hunsinger LAUREL   Consent:    Consent obtained:  Verbal   Consent given by:  Patient   Risks discussed:  Infection, pain, poor cosmetic result and need for additional repair   Alternatives discussed:  No treatment Anesthesia (see MAR for exact dosages):    Anesthesia method:  None Laceration details:     Location:  Face   Face location:  R eyebrow   Length (cm):  1.5   Depth (mm):  2 Exploration:    Hemostasis achieved with:  Direct pressure   Wound exploration: wound explored through full range of motion and entire depth of wound probed and visualized     Wound extent: no foreign bodies/material noted, no muscle damage noted, no nerve damage noted and no tendon damage noted     Contaminated: no   Treatment:    Area cleansed with:  Saline   Amount of cleaning:  Standard   Irrigation solution:  Sterile saline   Irrigation volume:  250   Irrigation method:  Syringe   Foreign body removal: no foreign bodies.   Skin repair:    Repair method:  Tissue adhesive Approximation:    Approximation:  Close   Vermilion border: well-aligned   Post-procedure details:    Dressing:  Open (no dressing)   Patient tolerance of procedure:  Tolerated well, no immediate complications   (including critical care time)  Medications Ordered in ED Medications  bacitracin ointment (1 application Topical Given 08/15/16 1107)  fentaNYL (SUBLIMAZE) injection 50 mcg (50 mcg Intravenous Given 08/15/16 0953)     Initial Impression / Assessment and Plan / ED Course  I have reviewed the triage vital signs and the nursing notes.  Pertinent labs & imaging results that were available during my care of the patient were reviewed by me and considered in my medical decision making (see chart for details).  Clinical Course  Value Comment By Time  DG Wrist Complete Right Comminuted fracture of right distal radius, styloid fracture noted  Lona Kettleshley Laurel Jaisean Monteforte, PA-C 09/27 [id number]    Patient presents to ED with right wrist pain s/p fall. Patient is afebrile and non-toxic appearing in NAD. VSS. Obvious deformity, swelling, and ecchymosis of right wrist noted. Neurovascularly intact. 1.5cm laceration to right lateral eyebrow. Bleeding controlled. NO LOC. Patient is alert and oriented. No focal neuro deficits on exam. Pt is  ambulatory. No battle sign or racoon eyes. Do not feel imaging of head is warranted at this time. X-ray remarkable for comminuted fracture of distal radius with dorsal angulation and avulsion fracture of ulnar styloid. Will consult hand surgery regarding management of fracture. Tetanus within last 5 yrs. Eyebrow laceration cleaned and explored in bloodless field. No obvious foreign bodies. Repaired with tissue adhesive. Superficial abrasion to right knee cleaned, ABX ointment placed, and dress. Pain managed in ED.  9:33 AM: Spoke with Dr. Caprice RedWiengold of Hand Surgery, greatly appreciate his time and input. No reduction needed at this time. Place pt in sugar tong splint, pain medication, and call office for appointment tomorrow.    Sugar tong splint placed. Neurovascularly intact following splint placement. Results and plan discussed with patient. Symptomatic management discussed. Wound care discussed. Rx  vicodin. Review of [place] controlled substance database shows no recent narcotic rx. Ortho contact information provided. Return precautions given. Pt voiced understanding and is agreeable.     Final Clinical Impressions(s) / ED Diagnoses   Final diagnoses:  Fall, initial encounter  Distal radius fracture, right, closed, initial encounter  Laceration    New Prescriptions Discharge Medication List as of 08/15/2016 10:54 AM    START taking these medications   Details  HYDROcodone-acetaminophen (NORCO/VICODIN) 5-325 MG tablet Take 1 tablet by mouth every 6 (six) hours as needed., Starting Wed 08/15/2016, Print         [place], [place] 08/15/16 1300    Lyndal Pulley, MD 08/15/16 [phone]

## 2016-08-15 NOTE — ED Notes (Signed)
PA at bedside.

## 2016-08-15 NOTE — ED Triage Notes (Signed)
Pt was taking her usual morning walk and tripped over uneven sidewalk; pt broke fall with right wrist which has obvious deformity; half-inch laceration noted to right eyebrow and scrape present on right knee; pt hit head on ground but denies LOC; A&Ox4.

## 2016-08-15 NOTE — ED Notes (Signed)
Ortho tech notified of splint need. 

## 2016-08-15 NOTE — ED Notes (Signed)
Patient transported to X-ray 

## 2017-07-27 IMAGING — CR DG WRIST COMPLETE 3+V*R*
3 series · 3 of 3 positions shown · non-contrast
Comparison: None.

CLINICAL DATA: Pain following fall

EXAM:
RIGHT WRIST - COMPLETE 3+ VIEW

[x wrist pa right]
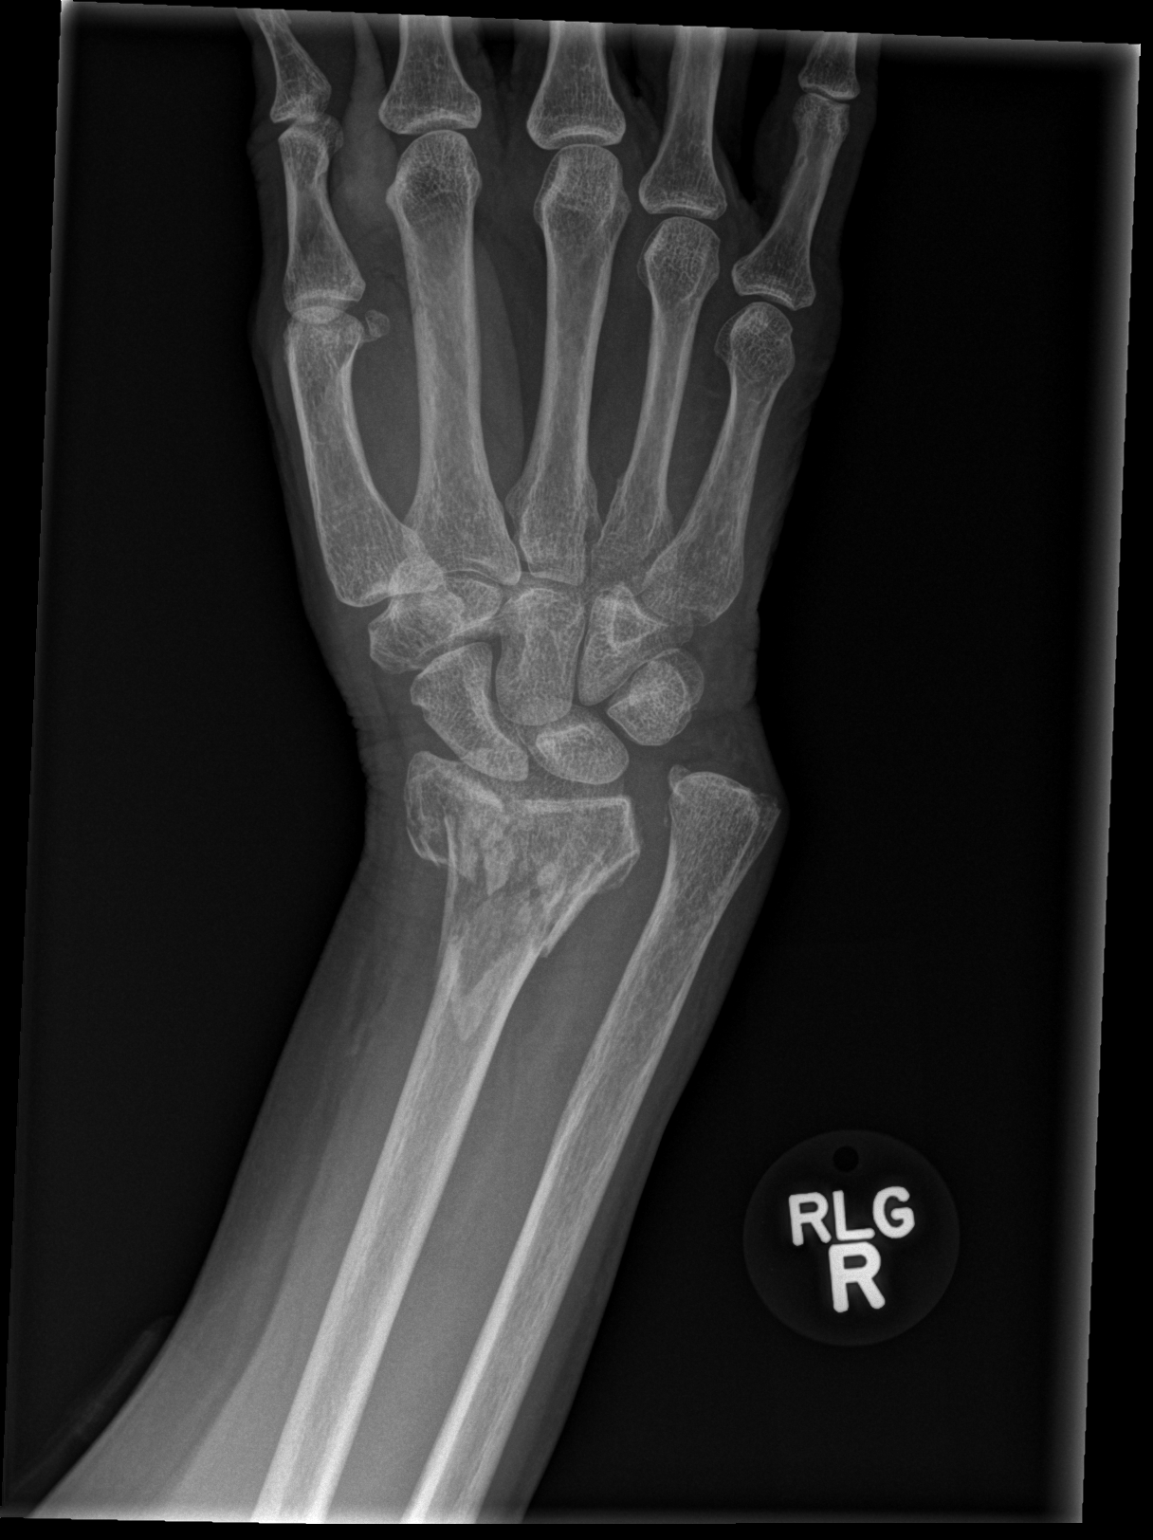

[x wrist obl right]
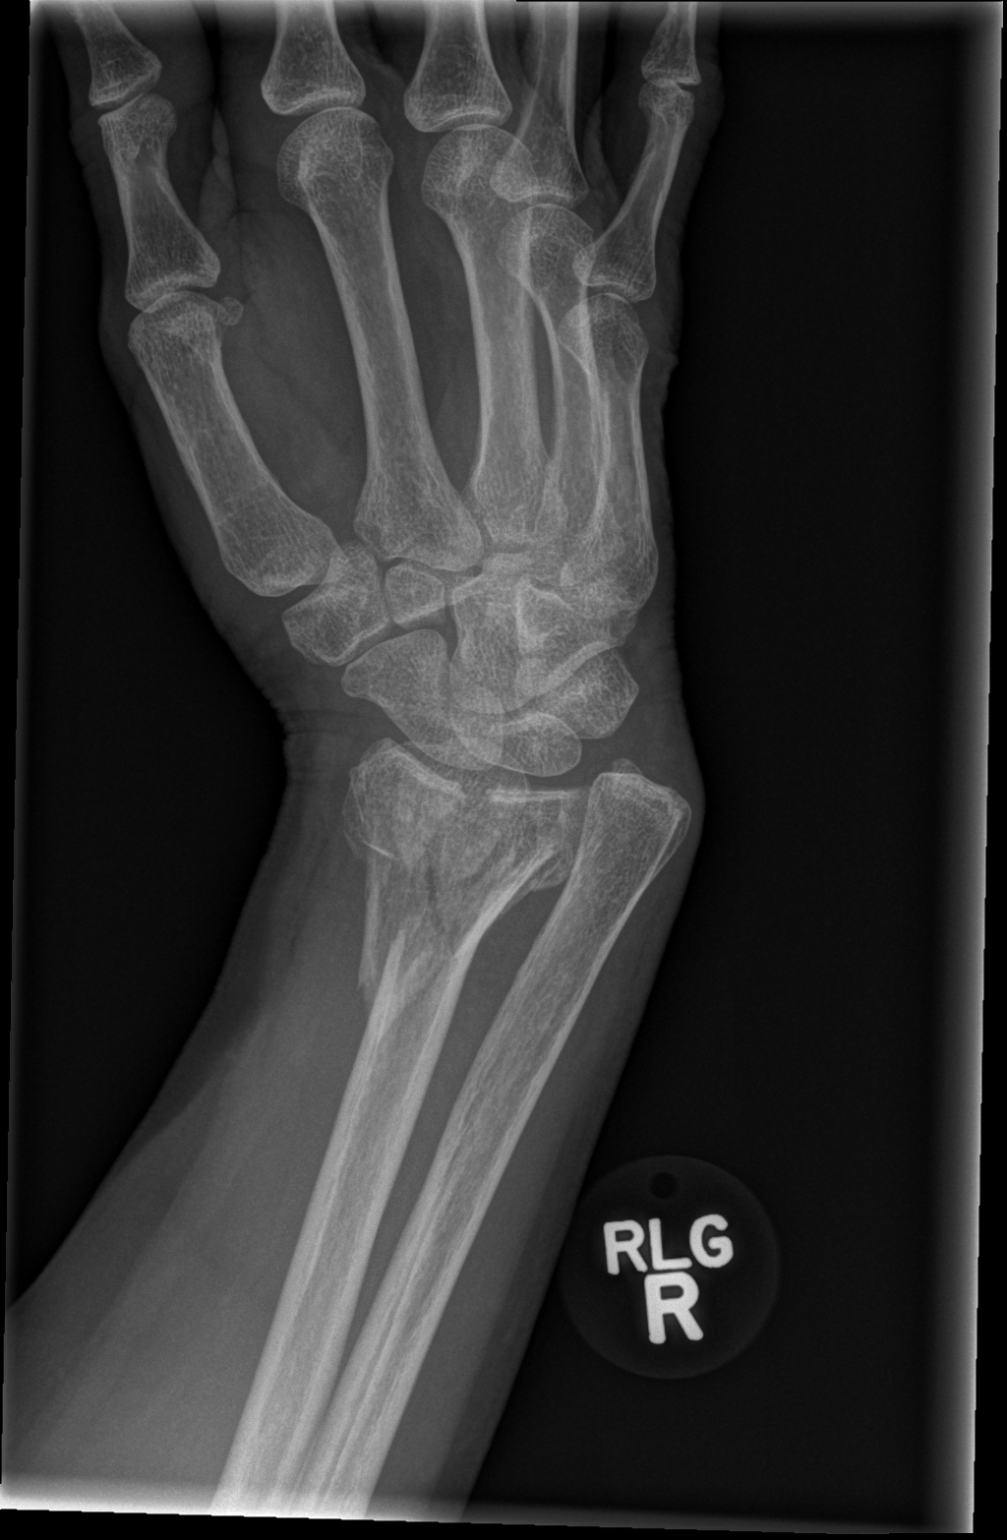

[x wrist lat right]
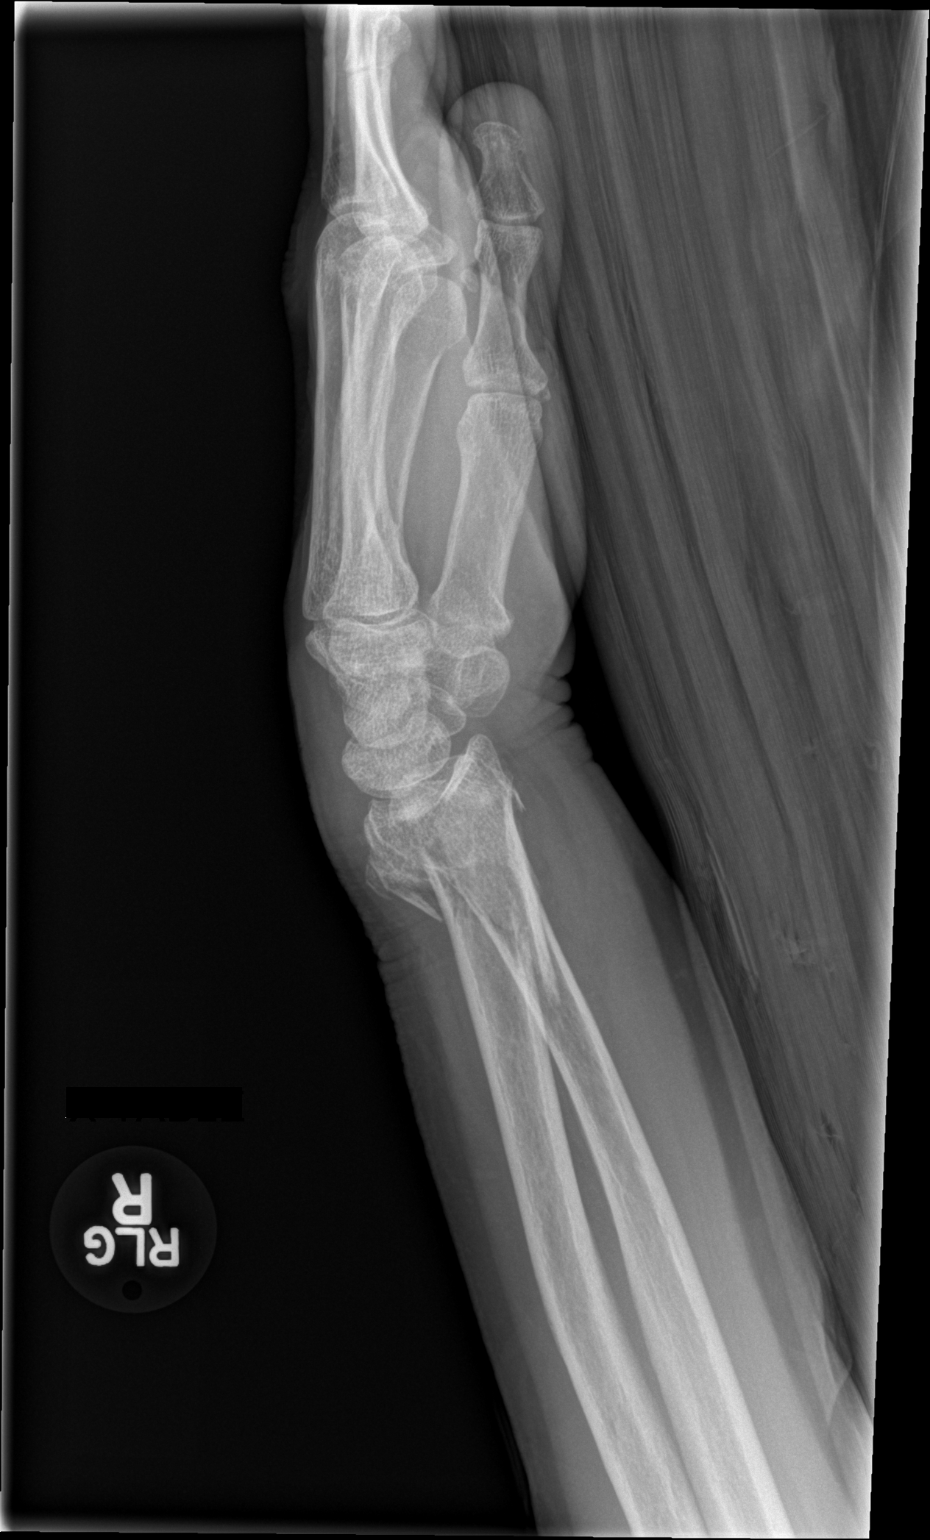

[3 of 3 positions shown; findings below may reference images not displayed]

FINDINGS: Frontal, oblique, and lateral views were obtained. There is a
comminuted fracture of the distal radial metaphysis with fracture
fragments extending into the radiocarpal joint space. There is
dorsal angulation distally. Several fracture fragments are impacted.
There is avulsion of the ulnar styloid. No dislocation. No other
fractures. No appreciable joint space narrowing.
IMPRESSION: Comminuted fracture distal radius with impacted fracture fragments.
Fracture fragments extend into the radiocarpal joint. There is
dorsal angulation distally. There is avulsion of the ulnar styloid.
No dislocation. No appreciable joint space narrowing or erosion peer
